# Patient Record
Sex: Female | Born: 1995 | Race: Black or African American | Hispanic: No | Marital: Single | Smoking: Former smoker
Health system: Southern US, Community
[De-identification: ages and names within clinical notes are randomized; demographics above are authoritative.]

---
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
## 2013-05-26 IMAGING — CR DG FINGER LITTLE 2+V*L*
3 series · 3 of 3 positions shown · non-contrast
Comparison: None.

CLINICAL DATA: Left little finger numbness

LEFT LITTLE FINGER 2+V

[x finger pa left]
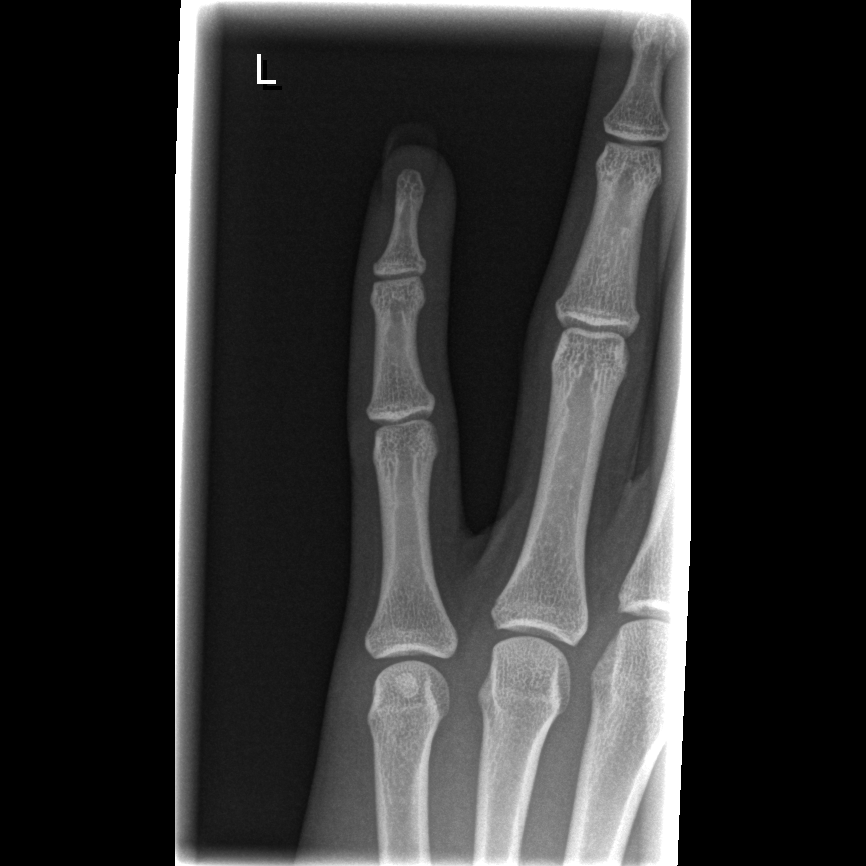

[x finger obl. left]
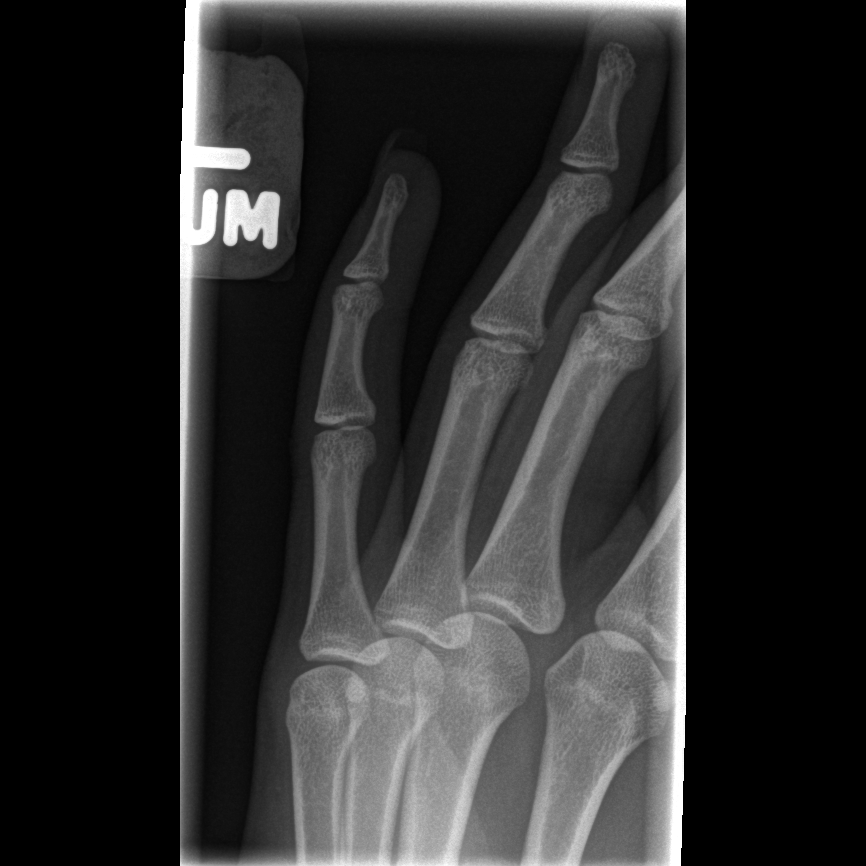

[x finger lateral left]
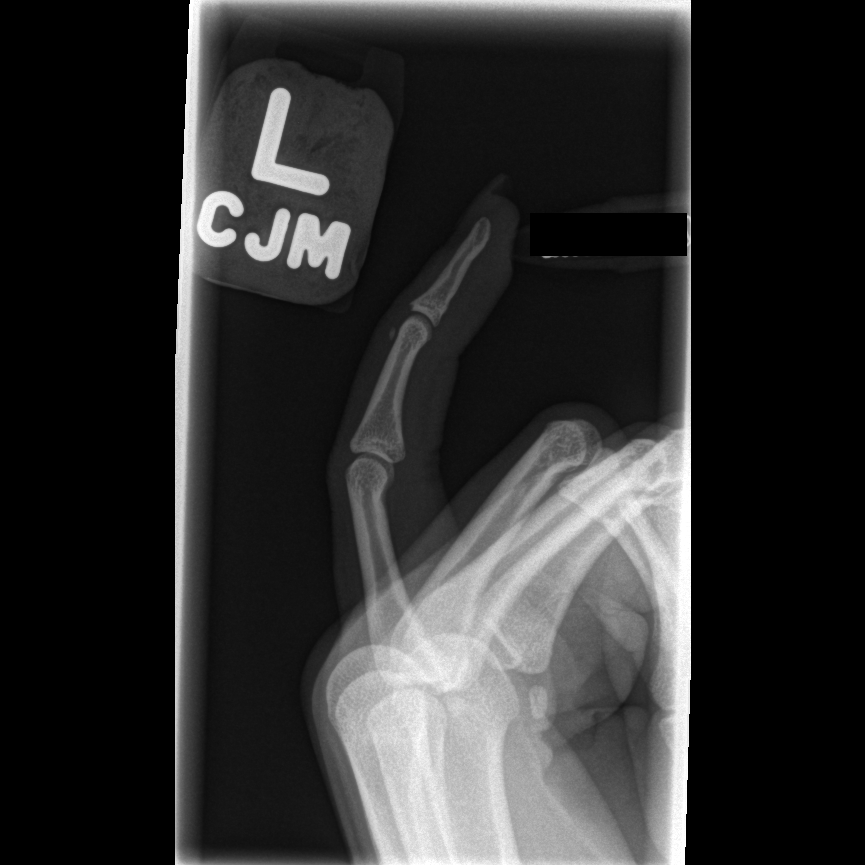

[3 of 3 positions shown; findings below may reference images not displayed]

FINDINGS: Three-view exam shows a dorsal avulsion fracture from the
base of the distal phalanx.  The avulsion fragment is retracted 3-4
mm from the donor site.  No other acute fracture.  No subluxation
or dislocation.
IMPRESSION: Dorsal avulsion fracture from the base of the distal phalanx.

## 2013-06-02 ENCOUNTER — Ambulatory Visit (INDEPENDENT_AMBULATORY_CARE_PROVIDER_SITE_OTHER): Admitting: Family Medicine

## 2013-06-02 VITALS — BP 112/68 | HR 69 | Temp 98.1°F | Resp 18 | Ht 64.5 in | Wt 135.0 lb

## 2013-06-02 DIAGNOSIS — B081 Molluscum contagiosum: Secondary | ICD-10-CM

## 2013-06-02 MED ORDER — IMIQUIMOD 5 % EX CREA: TOPICAL_CREAM | CUTANEOUS | Status: AC

## 2013-06-02 NOTE — Progress Notes (Signed)
17 yo in [employer] who went on mission last weekend and then developed nonpruritic bumps with white head on torso  Objective:  Umbilicated white papules on torso, randomly scattered on front and back (#6)  Assessment:  Molluscum contagiosum - Plan: imiquimod (ALDARA) 5 % cream  Signed, Elvina Sidle, MD

## 2013-06-02 NOTE — Patient Instructions (Addendum)
Molluscum Contagiosum  Molluscum contagiosum is a viral infection of the skin that causes smooth surfaced, firm, small (3 to 5 mm), dome-shaped bumps (papules) which are flesh-colored. The bumps usually do not hurt or itch. In children, they most often appear on the face, trunk, arms and legs. In adults, the growths are commonly found on the genitals, thighs, face, neck, and belly (abdomen). The infection may be spread to others by close (skin to skin) contact (such as occurs in schools and swimming pools), sharing towels and clothing, and through sexual contact. The bumps usually disappear without treatment in 2 to 4 months, especially in children. You may have them treated to avoid spreading them. Scraping (curetting) the middle part (central plug) of the bump with a needle or sharp curette, or application of liquid nitrogen for 8 or 9 seconds usually cures the infection.  HOME CARE INSTRUCTIONS   · Do not scratch the bumps. This may spread the infection to other parts of the body and to other people.  · Avoid close contact with others, including sexual contact, until the bumps disappear. Do not share towels or clothing.  · If liquid nitrogen was used, blisters will form. Leave the blisters alone and cover with a bandage. The tops will fall off by themselves in 7 to 14 days.  · Four months without a lesion is usually a cure.  SEEK IMMEDIATE MEDICAL CARE IF:  · You have a fever.  · You develop swelling, redness, pain, tenderness, or warmth in the areas of the bumps. They may be infected.  Document Released: 08/16/2000 Document Revised: 11/11/2011 Document Reviewed: 01/27/2009  Exit[employer]® Patient Information ©2014 [employer].

## 2016-10-01 ENCOUNTER — Encounter (HOSPITAL_COMMUNITY): Payer: Self-pay | Admitting: *Deleted

## 2016-10-01 ENCOUNTER — Ambulatory Visit (HOSPITAL_COMMUNITY)
Admission: EM | Admit: 2016-10-01 | Discharge: 2016-10-01 | Disposition: A | Discharge status: Home / Self Care | Attending: Family Medicine | Admitting: Family Medicine

## 2016-10-01 DIAGNOSIS — N3001 Acute cystitis with hematuria: Secondary | ICD-10-CM | POA: Insufficient documentation

## 2016-10-01 DIAGNOSIS — R319 Hematuria, unspecified: Secondary | ICD-10-CM | POA: Diagnosis present

## 2016-10-01 LAB — POCT URINALYSIS DIP (DEVICE)
Bilirubin Urine: NEGATIVE
GLUCOSE, UA: NEGATIVE mg/dL
KETONES UR: NEGATIVE mg/dL
Nitrite: POSITIVE — AB
PROTEIN: 100 mg/dL — AB
Specific Gravity, Urine: >=1.03 (ref 1.005–1.030)
Urobilinogen, UA: 0.2 mg/dL (ref 0.0–1.0)
pH: 5.5 (ref 5.0–8.0)

## 2016-10-01 LAB — POCT PREGNANCY, URINE: PREG TEST UR: NEGATIVE

## 2016-10-01 MED ORDER — CEPHALEXIN 500 MG PO CAPS: 500.0000 mg | ORAL_CAPSULE | Freq: Four times a day (QID) | ORAL | 0 refills | Status: AC

## 2016-10-01 MED ORDER — PHENAZOPYRIDINE HCL 200 MG PO TABS: 200.0000 mg | ORAL_TABLET | Freq: Three times a day (TID) | ORAL | 0 refills | Status: AC | PRN

## 2016-10-01 NOTE — ED Triage Notes (Signed)
Pt  Reports    Symptoms  Of  Hematuria      And    Difficulty  Urinating         X   sev      Burning      Frequent  Urination  And   Scanty  Output

## 2016-10-01 NOTE — ED Provider Notes (Signed)
CSN: [id number]     Arrival date & time 10/01/16  1019 History   First MD Initiated Contact with Patient 10/01/16 1154     Chief Complaint  Patient presents with  . Hematuria   (Consider location/radiation/quality/duration/timing/severity/associated sxs/prior Treatment) 21 year old female presents to clinic with chief complaint of dysuria, frequency, and urgency. She has had symptoms two days, treating herself with OTC azo with minimal relief, denies flank pain, vaginal discharge, itch, pelvic pain, abdominal pain, fever, or nausea.   The history is provided by the patient.  Hematuria     History reviewed. No pertinent past medical history. History reviewed. No pertinent surgical history. History reviewed. No pertinent family history. Social History  Substance Use Topics  . Smoking status: Former Games developer  . Smokeless tobacco: Not on file  . Alcohol use No   OB History    No data available     Review of Systems  Reason unable to perform ROS: as covered in HPI.  Genitourinary: Positive for hematuria.  All other systems reviewed and are negative.   Allergies  Patient has no known allergies.  Home Medications   Prior to Admission medications   Medication Sig Start Date End Date Taking? Authorizing Provider  cephALEXin (KEFLEX) 500 MG capsule Take 1 capsule (500 mg total) by mouth 4 (four) times daily. 10/01/16   Dorena Bodo, NP  imiquimod Mathis Dad) 5 % cream Apply topically 3 (three) times a week. 06/02/13   Elvina Sidle, MD  phenazopyridine (PYRIDIUM) 200 MG tablet Take 1 tablet (200 mg total) by mouth 3 (three) times daily as needed for pain. 10/01/16   Dorena Bodo, NP  PRESCRIPTION MEDICATION Birth control pill    Historical Provider, MD   Meds Ordered and Administered this Visit  Medications - No data to display  BP 112/67 (BP Location: Right Arm)   Pulse 82   Temp 98.6 F (37 C) (Oral)   Resp 18   LMP 09/24/2016   SpO2 100%  No data  found.   Physical Exam  Constitutional: She is oriented to person, place, and time. She appears well-developed and well-nourished. No distress.  Cardiovascular: Normal rate and regular rhythm.   Pulmonary/Chest: Effort normal and breath sounds normal.  Abdominal: Soft. Bowel sounds are normal. She exhibits no distension and no mass. There is no tenderness. There is no guarding and no CVA tenderness.  Neurological: She is alert and oriented to person, place, and time.  Skin: Skin is warm and dry. Capillary refill takes less than 2 seconds. She is not diaphoretic.  Psychiatric: She has a normal mood and affect.  Nursing note and vitals reviewed.   Urgent Care Course     Procedures (including critical care time)  Labs Review Labs Reviewed  POCT URINALYSIS DIP (DEVICE) - Abnormal; Notable for the following:       Result Value   Hgb urine dipstick LARGE (*)    Protein, ur 100 (*)    Nitrite POSITIVE (*)    Leukocytes, UA MODERATE (*)    All other components within normal limits  URINE CULTURE  POCT PREGNANCY, URINE    Imaging Review No results found.   Visual Acuity Review  Right Eye Distance:   Left Eye Distance:   Bilateral Distance:    Right Eye Near:   Left Eye Near:    Bilateral Near:         MDM   1. Acute cystitis with hematuria   You are being treated today  for a urinary tract infection. I have prescribed Keflex, take 1 tablet 4 times a day for 5 days. I have also prescribed Pyridium. Take 1 tablet a day 3 times a day for 2 days. Your urine will be sent for culture and you will be notified should any change in therapy be needed. Drink plenty of fluids and rest. Should your symptoms fail to resolve, follow up with your primary care provider or return to clinic.     Dorena BodoLawrence Damya Comley, NP 10/01/16 [phone]

## 2016-10-01 NOTE — Discharge Instructions (Signed)
You are being treated today for a urinary tract infection. I have prescribed Keflex, take 1 tablet 4 times a day for 5 days. I have also prescribed Pyridium. Take 1 tablet a day 3 times a day for 2 days. Your urine will be sent for culture and you will be notified should any change in therapy be needed. Drink plenty of fluids and rest. Should your symptoms fail to resolve, follow up with your primary care provider or return to clinic.  °

## 2016-10-02 LAB — URINE CULTURE
